# Patient Record
Sex: Female | Born: 1995 | Race: Black or African American | Hispanic: No | Marital: Single | Smoking: Never smoker
Health system: Southern US, Community
[De-identification: ages and names within clinical notes are randomized; demographics above are authoritative.]

---

## 2016-03-19 ENCOUNTER — Ambulatory Visit (HOSPITAL_COMMUNITY)
Admission: EM | Admit: 2016-03-19 | Discharge: 2016-03-19 | Disposition: A | Discharge status: Home / Self Care | Payer: BLUE CROSS/BLUE SHIELD | Attending: Physician Assistant | Admitting: Physician Assistant

## 2016-03-19 ENCOUNTER — Encounter (HOSPITAL_COMMUNITY): Payer: Self-pay | Admitting: Emergency Medicine

## 2016-03-19 DIAGNOSIS — N390 Urinary tract infection, site not specified: Secondary | ICD-10-CM | POA: Diagnosis not present

## 2016-03-19 LAB — POCT URINALYSIS DIP (DEVICE)
Bilirubin Urine: NEGATIVE
Glucose, UA: NEGATIVE mg/dL
NITRITE: NEGATIVE
PROTEIN: 100 mg/dL — AB
Specific Gravity, Urine: 1.025 (ref 1.005–1.030)
UROBILINOGEN UA: 2 mg/dL — AB (ref 0.0–1.0)
pH: 7 (ref 5.0–8.0)

## 2016-03-19 MED ORDER — CEPHALEXIN 500 MG PO CAPS: 1000.0000 mg | ORAL_CAPSULE | Freq: Two times a day (BID) | ORAL | 0 refills | Status: DC

## 2016-03-19 MED ORDER — PHENAZOPYRIDINE HCL 200 MG PO TABS: 200.0000 mg | ORAL_TABLET | Freq: Three times a day (TID) | ORAL | 0 refills | Status: DC

## 2016-03-19 NOTE — ED Provider Notes (Signed)
CSN: [id number]     Arrival date & time 03/19/16  1558 History   First MD Initiated Contact with Patient 03/19/16 1716     Chief Complaint  Patient presents with  . Abdominal Pain  . Urinary Frequency   (Consider location/radiation/quality/duration/timing/severity/associated sxs/prior Treatment) HPI NP History obtained from patient:   I have a urinary tract infection   2 days ago began to have frequency of urination 5-6 times per day, urine burns when "it hits the skin," occasional vaginal itching.  No relief from increased fluids. Sexually active Denies fever/chills.  History reviewed. No pertinent past medical history. History reviewed. No pertinent surgical history. History reviewed. No pertinent family history. Social History  Substance Use Topics  . Smoking status: Never Smoker  . Smokeless tobacco: Never Used  . Alcohol use Yes     Comment: Occasional   OB History    No data available     Review of Systems  Denies: HEADACHE, NAUSEA, ABDOMINAL PAIN, CHEST PAIN, CONGESTION,  SHORTNESS OF BREATH  Allergies  Review of patient's allergies indicates no known allergies.  Home Medications   Prior to Admission medications   Medication Sig Start Date End Date Taking? Authorizing Provider  cephALEXin (KEFLEX) 500 MG capsule Take 2 capsules (1,000 mg total) by mouth 2 (two) times daily. 03/19/16   Tharon AquasFrank C Patrick, PA  phenazopyridine (PYRIDIUM) 200 MG tablet Take 1 tablet (200 mg total) by mouth 3 (three) times daily. 03/19/16   Tharon AquasFrank C Patrick, PA   Meds Ordered and Administered this Visit  Medications - No data to display  BP 106/65 (BP Location: Right Arm)   Pulse (!) 51   Temp 98 F (36.7 C) (Oral)   Resp 18   LMP 03/19/2016 (Exact Date)   SpO2 100%  No data found.   Physical Exam NURSES NOTES AND VITAL SIGNS REVIEWED. CONSTITUTIONAL: Well developed, well nourished, no acute distress HEENT: normocephalic, atraumatic EYES: Conjunctiva normal NECK:normal ROM,  supple, no adenopathy PULMONARY:No respiratory distress, normal effort ABDOMINAL: Soft, ND, NT BS+, No CVAT MUSCULOSKELETAL: Normal ROM of all extremities,  SKIN: warm and dry without rash PSYCHIATRIC: Mood and affect, behavior are normal  Urgent Care Course   Clinical Course    Procedures (including critical care time)  Labs Review Labs Reviewed  POCT URINALYSIS DIP (DEVICE) - Abnormal; Notable for the following:       Result Value   Ketones, ur TRACE (*)    Hgb urine dipstick LARGE (*)    Protein, ur 100 (*)    Urobilinogen, UA 2.0 (*)    Leukocytes, UA LARGE (*)    All other components within normal limits    Imaging Review No results found.   Visual Acuity Review  Right Eye Distance:   Left Eye Distance:   Bilateral Distance:    Right Eye Near:   Left Eye Near:    Bilateral Near:         MDM   1. Lower urinary tract infectious disease     Patient is reassured that there are no issues that require transfer to higher level of care at this time or additional tests. Patient is advised to continue home symptomatic treatment. Patient is advised that if there are new or worsening symptoms to attend the emergency department, contact primary care provider, or return to UC. Instructions of care provided discharged home in stable condition.    THIS NOTE WAS GENERATED USING A VOICE RECOGNITION SOFTWARE PROGRAM. ALL REASONABLE EFFORTS  WERE  MADE TO PROOFREAD THIS DOCUMENT FOR ACCURACY.  I have verbally reviewed the discharge instructions with the patient. A printed AVS was given to the patient.  All questions were answered prior to discharge.      Tharon Aquas, PA 03/19/16 1757

## 2016-03-19 NOTE — ED Triage Notes (Signed)
[facility] a [facility] urinary pressure, frequency and lower abdominal pressure when she urinates. The patient stated that the symptoms started last night.

## 2017-11-18 ENCOUNTER — Encounter (HOSPITAL_COMMUNITY): Payer: Self-pay | Admitting: Emergency Medicine

## 2017-11-18 ENCOUNTER — Other Ambulatory Visit: Payer: Self-pay

## 2017-11-18 ENCOUNTER — Emergency Department (HOSPITAL_COMMUNITY): Payer: BLUE CROSS/BLUE SHIELD

## 2017-11-18 ENCOUNTER — Emergency Department (HOSPITAL_COMMUNITY)
Admission: EM | Admit: 2017-11-18 | Discharge: 2017-11-18 | Disposition: A | Discharge status: Home / Self Care | Payer: BLUE CROSS/BLUE SHIELD | Attending: Emergency Medicine | Admitting: Emergency Medicine

## 2017-11-18 DIAGNOSIS — K2970 Gastritis, unspecified, without bleeding: Secondary | ICD-10-CM | POA: Diagnosis not present

## 2017-11-18 DIAGNOSIS — R1011 Right upper quadrant pain: Secondary | ICD-10-CM | POA: Diagnosis present

## 2017-11-18 DIAGNOSIS — K2900 Acute gastritis without bleeding: Secondary | ICD-10-CM

## 2017-11-18 LAB — URINALYSIS, ROUTINE W REFLEX MICROSCOPIC
BILIRUBIN URINE: NEGATIVE
Glucose, UA: NEGATIVE mg/dL
Hgb urine dipstick: NEGATIVE
KETONES UR: NEGATIVE mg/dL
Leukocytes, UA: NEGATIVE
NITRITE: NEGATIVE
PROTEIN: NEGATIVE mg/dL
Specific Gravity, Urine: 1.015 (ref 1.005–1.030)
pH: 6 (ref 5.0–8.0)

## 2017-11-18 LAB — CBC
HCT: 34.8 % — ABNORMAL LOW (ref 36.0–46.0)
Hemoglobin: 10.9 g/dL — ABNORMAL LOW (ref 12.0–15.0)
MCH: 28.7 pg (ref 26.0–34.0)
MCHC: 31.3 g/dL (ref 30.0–36.0)
MCV: 91.6 fL (ref 78.0–100.0)
PLATELETS: 223 10*3/uL (ref 150–400)
RBC: 3.8 MIL/uL — ABNORMAL LOW (ref 3.87–5.11)
RDW: 12.1 % (ref 11.5–15.5)
WBC: 4.8 10*3/uL (ref 4.0–10.5)

## 2017-11-18 LAB — COMPREHENSIVE METABOLIC PANEL
ALK PHOS: 41 U/L (ref 38–126)
ALT: 12 U/L — AB (ref 14–54)
AST: 23 U/L (ref 15–41)
Albumin: 3.7 g/dL (ref 3.5–5.0)
Anion gap: 7 (ref 5–15)
BILIRUBIN TOTAL: 2.2 mg/dL — AB (ref 0.3–1.2)
BUN: 6 mg/dL (ref 6–20)
CALCIUM: 9.1 mg/dL (ref 8.9–10.3)
CO2: 24 mmol/L (ref 22–32)
CREATININE: 0.84 mg/dL (ref 0.44–1.00)
Chloride: 107 mmol/L (ref 101–111)
GFR calc Af Amer: >60 mL/min (ref >60)
Glucose, Bld: 86 mg/dL (ref 65–99)
Potassium: 4.1 mmol/L (ref 3.5–5.1)
Sodium: 138 mmol/L (ref 135–145)
TOTAL PROTEIN: 7.3 g/dL (ref 6.5–8.1)

## 2017-11-18 LAB — I-STAT BETA HCG BLOOD, ED (MC, WL, AP ONLY): I-stat hCG, quantitative: <5 m[IU]/mL (ref <5)

## 2017-11-18 LAB — LIPASE, BLOOD: Lipase: 23 U/L (ref 11–51)

## 2017-11-18 MED ORDER — GI COCKTAIL ~~LOC~~
30.0000 mL | Freq: Once | ORAL | Status: AC
  Administered 2017-11-18: 30 mL via ORAL
  Filled 2017-11-18: qty 30

## 2017-11-18 MED ORDER — RANITIDINE HCL 150 MG PO CAPS: 150.0000 mg | ORAL_CAPSULE | Freq: Every day | ORAL | 0 refills | Status: AC

## 2017-11-18 NOTE — ED Provider Notes (Signed)
[facility] EMERGENCY DEPARTMENT Provider Note   CSN: [id number] Arrival date & time: 11/18/17  1302   History   Chief Complaint Chief Complaint  Patient presents with  . Abdominal Pain    HPI Kelly Horne is a 22 y.o. female.  HPI   22 year old female presents today with complaints of upper abdominal pain.  Patient notes pain in her upper abdomen starting this morning.  Patient denies any nausea vomiting fever, notes normal bowel movements recently yesterday diarrhea.  She denies any fever.  She denies any vaginal discharge or bleeding.  She reports pain is not worse when eating.  Tolerating p.o.   History reviewed. No pertinent past medical history.  There are no active problems to display for this patient.   History reviewed. No pertinent surgical history.   OB History   None      Home Medications    Prior to Admission medications   Medication Sig Start Date End Date Taking? Authorizing Provider  cephALEXin (KEFLEX) 500 MG capsule Take 2 capsules (1,000 mg total) by mouth 2 (two) times daily. 03/19/16   Tharon Aquas, PA  phenazopyridine (PYRIDIUM) 200 MG tablet Take 1 tablet (200 mg total) by mouth 3 (three) times daily. 03/19/16   Tharon Aquas, PA  ranitidine (ZANTAC) 150 MG capsule Take 1 capsule (150 mg total) by mouth daily. 11/18/17   Eyvonne Mechanic, PA-C    Family History No family history on file.  Social History Social History   Tobacco Use  . Smoking status: Never Smoker  . Smokeless tobacco: Never Used  Substance Use Topics  . Alcohol use: Yes    Comment: Occasional  . Drug use: Yes    Types: Marijuana     Allergies   Patient has no known allergies.   Review of Systems Review of Systems  All other systems reviewed and are negative.  Physical Exam Updated Vital Signs BP 115/73 (BP Location: Right Arm)   Pulse 71   Temp 99.8 F (37.7 C) (Oral)   Resp 16   LMP 10/19/2017 (Exact Date)   SpO2 100%    Physical Exam  Constitutional: She is oriented to person, place, and time. She appears well-developed and well-nourished.  HENT:  Head: Normocephalic and atraumatic.  Eyes: Pupils are equal, round, and reactive to light. Conjunctivae are normal. Right eye exhibits no discharge. Left eye exhibits no discharge. No scleral icterus.  Neck: Normal range of motion. No JVD present. No tracheal deviation present.  Pulmonary/Chest: Effort normal. No stridor.  Abdominal:  Tenderness to palpation of epigastric region, quadrant tenderness, remainder of abdomen soft nontender  Neurological: She is alert and oriented to person, place, and time. Coordination normal.  Psychiatric: She has a normal mood and affect. Her behavior is normal. Judgment and thought content normal.  Nursing note and vitals reviewed.    ED Treatments / Results  Labs (all labs ordered are listed, but only abnormal results are displayed) Labs Reviewed  COMPREHENSIVE METABOLIC PANEL - Abnormal; Notable for the following components:      Result Value   ALT 12 (*)    Total Bilirubin 2.2 (*)    All other components within normal limits  CBC - Abnormal; Notable for the following components:   RBC 3.80 (*)    Hemoglobin 10.9 (*)    HCT 34.8 (*)    All other components within normal limits  LIPASE, BLOOD  URINALYSIS, ROUTINE W REFLEX MICROSCOPIC  I-STAT BETA HCG BLOOD,  ED ([facility], [facility], [facility] ONLY)    EKG None  Radiology [place] Abdomen Limited Ruq  Result Date: 11/18/2017 CLINICAL DATA:  Right upper quadrant pain for 1 week. EXAM: ULTRASOUND ABDOMEN LIMITED RIGHT UPPER QUADRANT COMPARISON:  None. FINDINGS: Gallbladder: No gallstones or wall thickening visualized. No sonographic Murphy sign noted by sonographer. Common bile duct: Diameter: 3 mm, within normal limits. Liver: No focal lesion identified. Within normal limits in parenchymal echogenicity. Portal vein is patent on color Doppler imaging with normal direction of blood flow  towards the liver. IMPRESSION: Negative. Electronically Signed   By: Myles RosenthalJohn  Stahl M.D.   On: 11/18/2017 18:25    Procedures Procedures (including critical care time)  Medications Ordered in ED Medications  gi cocktail (Maalox,Lidocaine,Donnatal) (30 mLs Oral Given 11/18/17 1750)     Initial Impression / Assessment and Plan / ED Course  I have reviewed the triage vital signs and the nursing notes.  Pertinent labs & imaging results that were available during my care of the patient were reviewed by me and considered in my medical decision making (see chart for details).     Labs: I-STAT beta-hCG, lipase, CMP, CBC, U  Imaging:  Consults:  Therapeutics:  Discharge Meds:   Assessment/Plan: 22 year old female presents today with complaints of abdominal pain.  Patient had resolution of symptoms with GI cocktail reassuring right upper quadrant ultrasound likely gastritis, no acute tenderness on repeated evaluation no elevation white count low suspicion for infection.  Strict return precautions given, follow-up information given.  Verbalized understanding and agreement to today's plan.   Final Clinical Impressions(s) / ED Diagnoses   Final diagnoses:  RUQ abdominal pain  Acute gastritis without hemorrhage, unspecified gastritis type    ED Discharge Orders        Ordered    ranitidine (ZANTAC) 150 MG capsule  Daily     11/18/17 1856       Eyvonne MechanicHedges, Jawan Chavarria, PA-C 11/19/17 1434    Mancel BaleWentz, Elliott, MD 11/19/17 2358

## 2017-11-18 NOTE — ED Triage Notes (Signed)
Patient complains of abdominal pain, soft stool, and tender breasts x4 days. Patient alert, oriented, and in no apparent distress at this time.

## 2017-11-18 NOTE — Discharge Instructions (Addendum)
Please read attached information. If you experience any new or worsening signs or symptoms please return to the emergency room for evaluation. Please follow-up with your primary care provider or specialist as discussed. Please use medication prescribed only as directed and discontinue taking if you have any concerning signs or symptoms.   °

## 2017-11-18 NOTE — ED Notes (Addendum)
LMP 5/13 , states has felt boated x 3 days and  Tender touch  States has had unprotected intercourse denies vag d/c or dysuria denies n/v breast are tender

## 2018-11-22 IMAGING — US US ABDOMEN LIMITED
1 series · 14 of 25 positions shown · non-contrast
Comparison: None.

CLINICAL DATA: Right upper quadrant pain for 1 week.

EXAM:
ULTRASOUND ABDOMEN LIMITED RIGHT UPPER QUADRANT

[Series 1: us abdomen limited · 0.20mm/px · 14 of 30 slices shown]
[im 1/30]
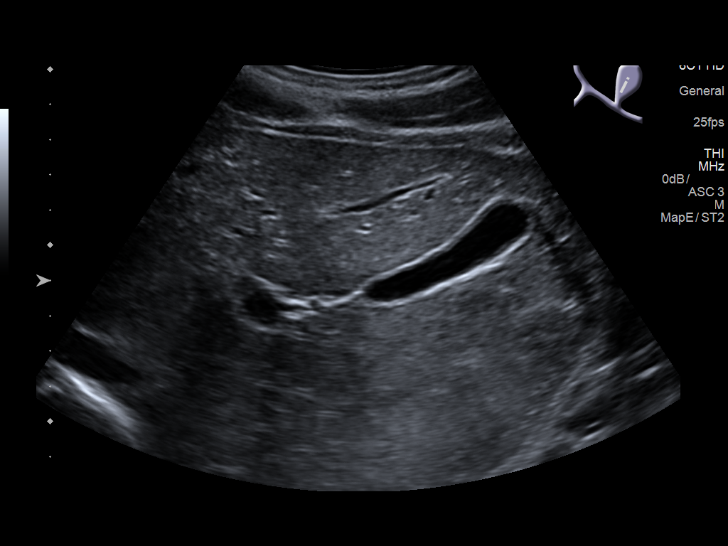
[im 3/30]
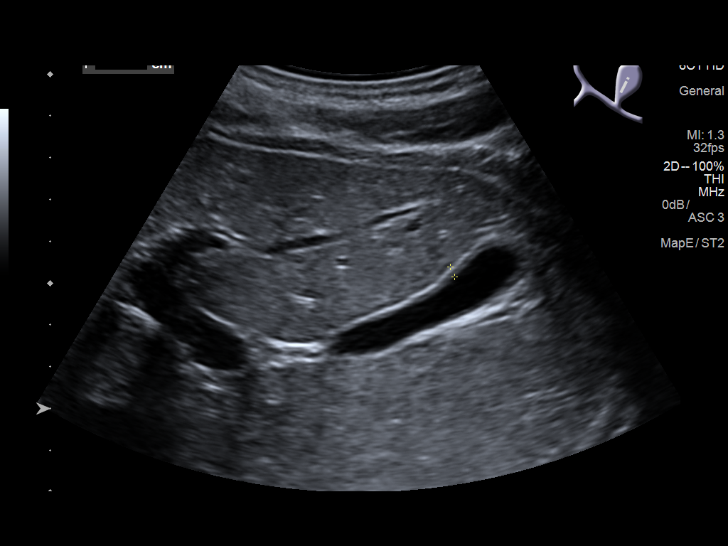
[im 5/30]
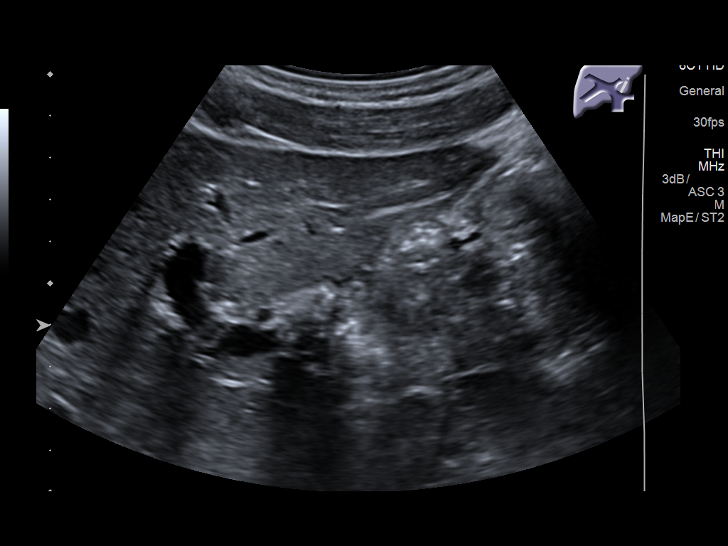
[im 8/30]
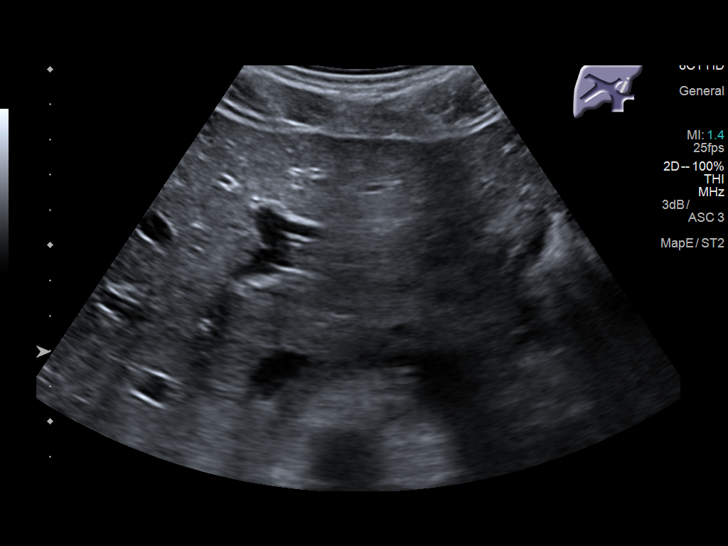
[im 10/30]
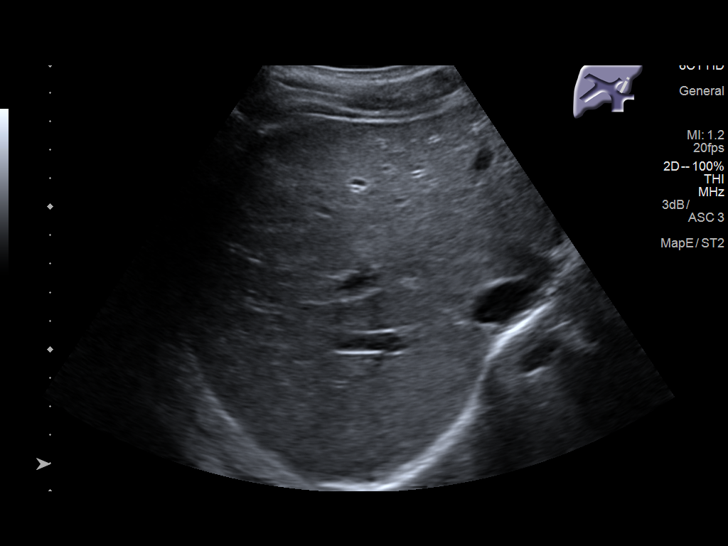
[im 11/30]
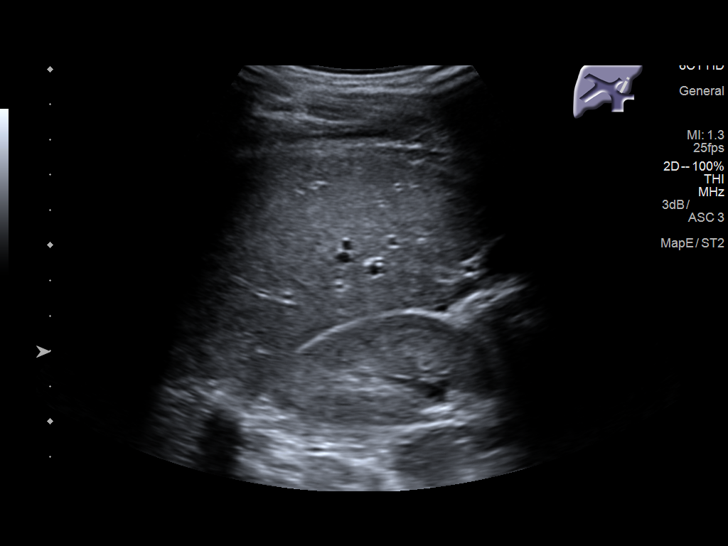
[im 14/30]
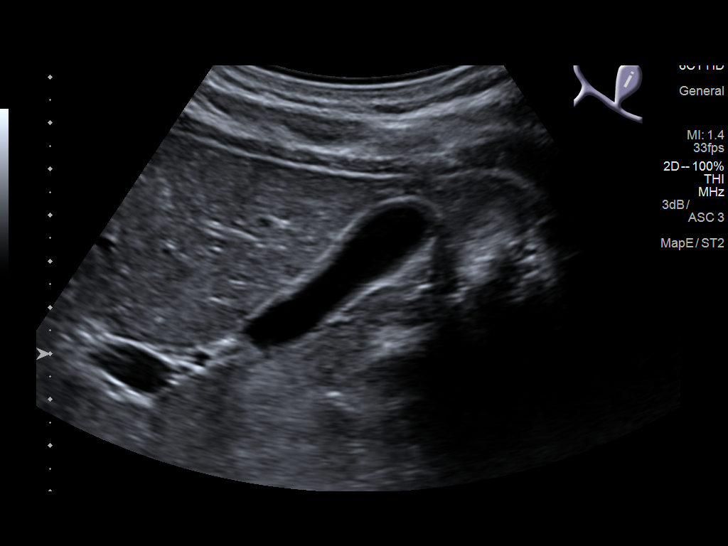
[im 16/30]
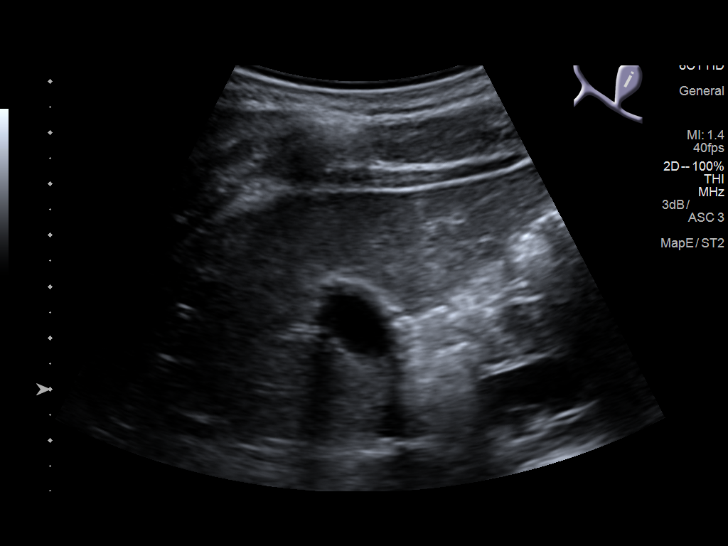
[im 19/30]
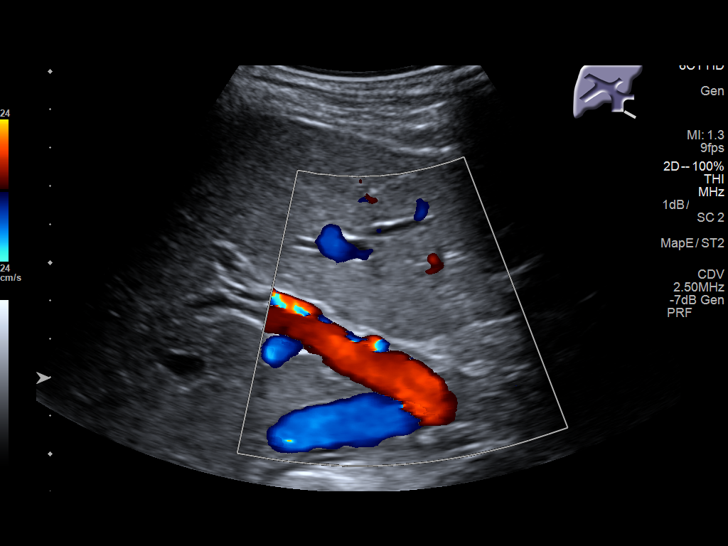
[im 20/30]
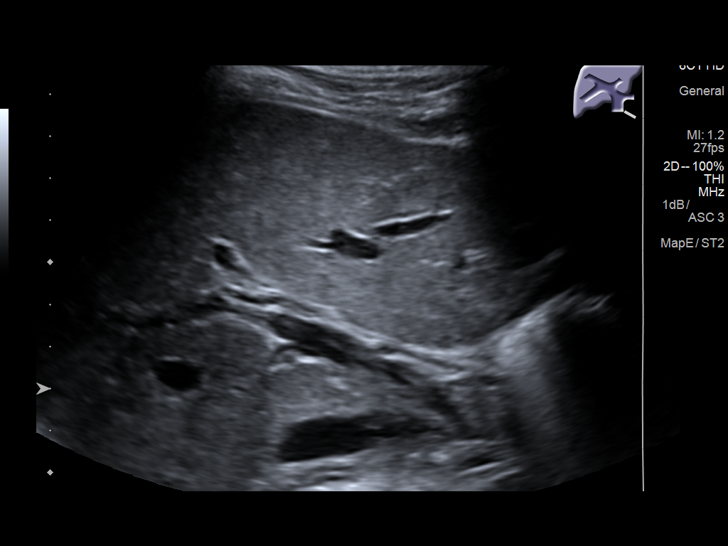
[im 22/30]
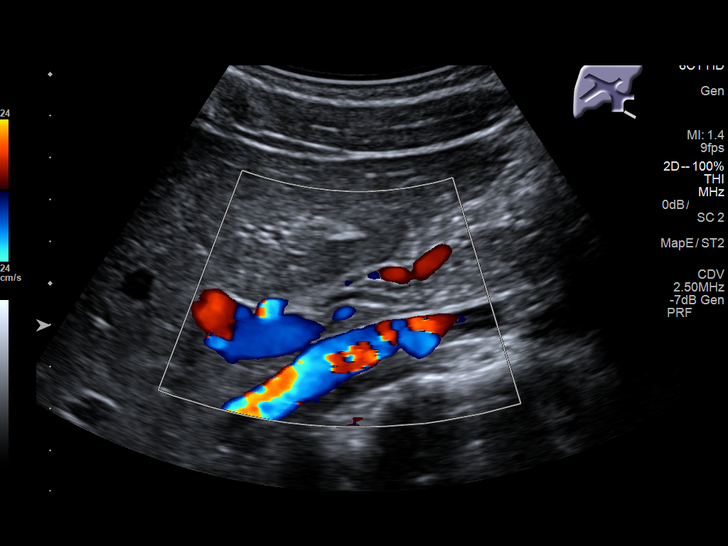
[im 25/30]
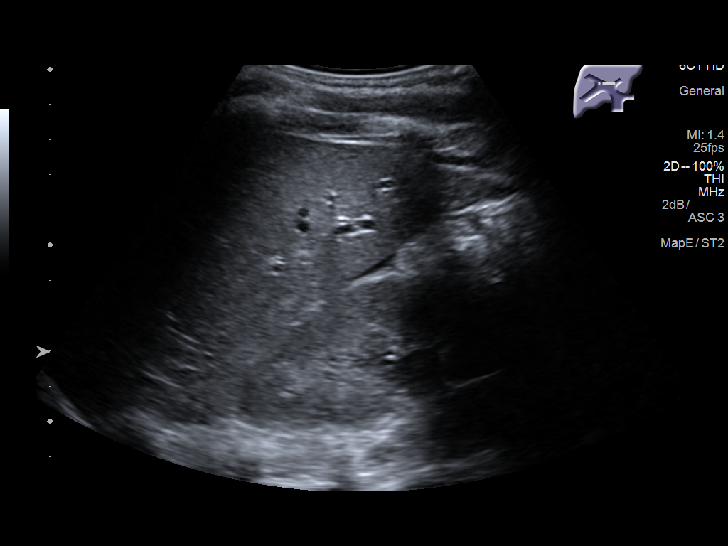
[im 27/30]
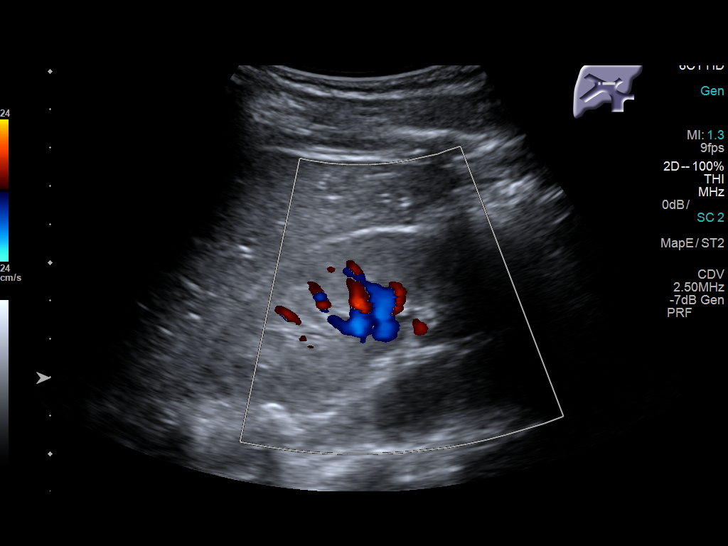
[im 30/30]
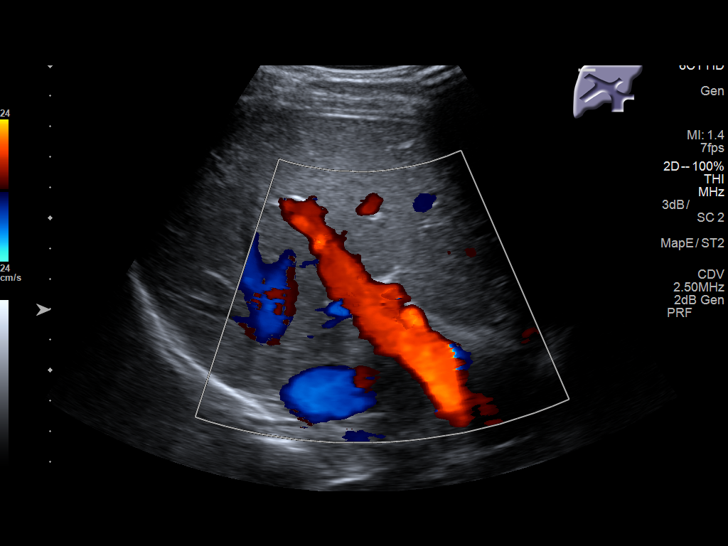

[14 of 25 positions shown; findings below may reference images not displayed]

FINDINGS: Gallbladder:

No gallstones or wall thickening visualized. No sonographic Murphy
sign noted by sonographer.

Common bile duct:

Diameter: 3 mm, within normal limits.

Liver:

No focal lesion identified. Within normal limits in parenchymal
echogenicity. Portal vein is patent on color Doppler imaging with
normal direction of blood flow towards the liver.
IMPRESSION: Negative.

## 2019-01-17 ENCOUNTER — Ambulatory Visit (HOSPITAL_COMMUNITY)
Admission: EM | Admit: 2019-01-17 | Discharge: 2019-01-17 | Disposition: A | Discharge status: Home / Self Care | Payer: 59 | Attending: Family Medicine | Admitting: Family Medicine

## 2019-01-17 ENCOUNTER — Other Ambulatory Visit: Payer: Self-pay

## 2019-01-17 ENCOUNTER — Encounter (HOSPITAL_COMMUNITY): Payer: Self-pay

## 2019-01-17 DIAGNOSIS — N760 Acute vaginitis: Secondary | ICD-10-CM | POA: Diagnosis not present

## 2019-01-17 DIAGNOSIS — N898 Other specified noninflammatory disorders of vagina: Secondary | ICD-10-CM | POA: Insufficient documentation

## 2019-01-17 DIAGNOSIS — B9689 Other specified bacterial agents as the cause of diseases classified elsewhere: Secondary | ICD-10-CM

## 2019-01-17 MED ORDER — AZITHROMYCIN 250 MG PO TABS
1000.0000 mg | ORAL_TABLET | Freq: Once | ORAL | Status: AC
  Administered 2019-01-17: 1000 mg via ORAL

## 2019-01-17 MED ORDER — CEFTRIAXONE SODIUM 250 MG IJ SOLR
250.0000 mg | Freq: Once | INTRAMUSCULAR | Status: AC
  Administered 2019-01-17: 250 mg via INTRAMUSCULAR

## 2019-01-17 MED ORDER — AZITHROMYCIN 250 MG PO TABS
ORAL_TABLET | ORAL | Status: AC
  Filled 2019-01-17: qty 4

## 2019-01-17 MED ORDER — METRONIDAZOLE 500 MG PO TABS: 500.0000 mg | ORAL_TABLET | Freq: Two times a day (BID) | ORAL | 0 refills | Status: AC

## 2019-01-17 MED ORDER — CEFTRIAXONE SODIUM 250 MG IJ SOLR
INTRAMUSCULAR | Status: AC
  Filled 2019-01-17: qty 250

## 2019-01-17 NOTE — ED Triage Notes (Signed)
Pt presents with vaginal irritation, discharge, and swelling X 14 days.

## 2019-01-17 NOTE — ED Provider Notes (Signed)
[facility]    CSN: [id number] Arrival date & time: 01/17/19  1440     History   Chief Complaint Chief Complaint  Patient presents with  . Vaginitis    HPI Kelly Horne is a 23 y.o. female.   Pt is a 23 year old female that presents with vaginal discharge that has been waxing and waning over the past 2 weeks. She initially thought she had been exposed to STDs and was tested at another clinic and never received any results. The discharge has continued and Patient's last menstrual period was 01/13/2019. There is mild swelling and irritation to the vaginal area.  No associated abdominal pain, back pain, fever, chills or body aches.  ROS per HPI       History reviewed. No pertinent past medical history.  There are no active problems to display for this patient.   History reviewed. No pertinent surgical history.  OB History   No obstetric history on file.      Home Medications    Prior to Admission medications   Medication Sig Start Date End Date Taking? Authorizing Provider  metroNIDAZOLE (FLAGYL) 500 MG tablet Take 1 tablet (500 mg total) by mouth 2 (two) times daily. 01/17/19   Dahlia ByesBast, Kanyia Heaslip A, NP  ranitidine (ZANTAC) 150 MG capsule Take 1 capsule (150 mg total) by mouth daily. 11/18/17   Eyvonne MechanicHedges, Jeffrey, PA-C    Family History Family History  Family history unknown: Yes    Social History Social History   Tobacco Use  . Smoking status: Never Smoker  . Smokeless tobacco: Never Used  Substance Use Topics  . Alcohol use: Yes    Comment: Occasional  . Drug use: Yes    Types: Marijuana     Allergies   Patient has no known allergies.   Review of Systems Review of Systems   Physical Exam Triage Vital Signs ED Triage Vitals  Enc Vitals Group     BP 01/17/19 1459 108/61     Pulse Rate 01/17/19 1459 68     Resp 01/17/19 1459 16     Temp 01/17/19 1459 97.9 F (36.6 C)     Temp Source 01/17/19 1459 Temporal     SpO2 01/17/19 1459  100 %     Weight --      Height --      Head Circumference --      Peak Flow --      Pain Score 01/17/19 1506 5     Pain Loc --      Pain Edu? --      Excl. in GC? --    No data found.  Updated Vital Signs BP 108/61 (BP Location: Left Arm)   Pulse 68   Temp 97.9 F (36.6 C) (Temporal)   Resp 16   LMP 01/13/2019   SpO2 100%   Visual Acuity Right Eye Distance:   Left Eye Distance:   Bilateral Distance:    Right Eye Near:   Left Eye Near:    Bilateral Near:     Physical Exam Vitals signs and nursing note reviewed.  Constitutional:      General: She is not in acute distress.    Appearance: Normal appearance. She is not ill-appearing, toxic-appearing or diaphoretic.  HENT:     Head: Normocephalic and atraumatic.     Nose: Nose normal.  Eyes:     Conjunctiva/sclera: Conjunctivae normal.  Pulmonary:     Effort: Pulmonary effort is normal.  Abdominal:     Palpations: Abdomen is soft.     Tenderness: There is no abdominal tenderness.  Genitourinary:    General: Normal vulva.     Pubic Area: No rash.      Vagina: Vaginal discharge present. No tenderness.     Cervix: Discharge present. No cervical motion tenderness, friability or erythema.     Comments:  Blood tinged, purulent, malodorous discharge.  Skin:    General: Skin is warm and dry.  Neurological:     Mental Status: She is alert.  Psychiatric:        Mood and Affect: Mood normal.      UC Treatments / Results  Labs (all labs ordered are listed, but only abnormal results are displayed) Labs Reviewed  CERVICOVAGINAL ANCILLARY ONLY    EKG   Radiology No results found.  Procedures Procedures (including critical care time)  Medications Ordered in UC Medications  cefTRIAXone (ROCEPHIN) injection 250 mg (250 mg Intramuscular Given 01/17/19 1532)  azithromycin (ZITHROMAX) tablet 1,000 mg (1,000 mg Oral Given 01/17/19 1532)  azithromycin (ZITHROMAX) 250 MG tablet (has no administration in time range)   cefTRIAXone (ROCEPHIN) 250 MG injection (has no administration in time range)    Initial Impression / Assessment and Plan / UC Course  I have reviewed the triage vital signs and the nursing notes.  Pertinent labs & imaging results that were available during my care of the patient were reviewed by me and considered in my medical decision making (see chart for details).    Vaginal discharge.  Based on exam, symptoms and possible exposure to STDs we will go ahead and treat for STDs prophylactically today in clinic.  Treating for gonorrhea and chlamydia.  Also sending Flagyl to the pharmacy to treat for bacterial vaginosis and cover for trichomonas.  No concern for PID today. Follow up as needed for continued or worsening symptoms  Final Clinical Impressions(s) / UC Diagnoses   Final diagnoses:  Vaginal discharge     Discharge Instructions     Based on exam and symptoms we will go ahead and treat you for gonorrhea and chlamydia today.  Also covering you and treating you for bacterial vaginosis. Sending Flagyl to the pharmacy for this.  Make sure you take the medication as prescribed.  Do not drink alcohol with this medication. Refrain from sexual activity until results are known.    ED Prescriptions    Medication Sig Dispense Auth. Provider   metroNIDAZOLE (FLAGYL) 500 MG tablet Take 1 tablet (500 mg total) by mouth 2 (two) times daily. 14 tablet Loura Halt A, NP     Controlled Substance Prescriptions [place] Controlled Substance Registry consulted? Not Applicable   Orvan July, NP 01/17/19 1537

## 2019-01-17 NOTE — Discharge Instructions (Signed)
Based on exam and symptoms we will go ahead and treat you for gonorrhea and chlamydia today.  Also covering you and treating you for bacterial vaginosis. Sending Flagyl to the pharmacy for this.  Make sure you take the medication as prescribed.  Do not drink alcohol with this medication. Refrain from sexual activity until results are known.

## 2019-01-20 LAB — CERVICOVAGINAL ANCILLARY ONLY
Candida vaginitis: NEGATIVE
Chlamydia: NEGATIVE
Neisseria Gonorrhea: NEGATIVE
Trichomonas: POSITIVE — AB

## 2019-01-21 ENCOUNTER — Telehealth (HOSPITAL_COMMUNITY): Payer: Self-pay | Admitting: Emergency Medicine

## 2019-01-21 NOTE — Telephone Encounter (Signed)
Bacterial Vaginosis test is positive.  Prescription for metronidazole was given at the urgent care visit.  Trichomonas is positive. Rx metronidazole was given at the urgent care visit. Pt needs education to please refrain from sexual intercourse for 7 days to give the medicine time to work. Sexual partners need to be notified and tested/treated. Condoms may reduce risk of reinfection. Recheck for further evaluation if symptoms are not improving.   Patient contacted and made aware of    results, all questions answered
